# Patient Record
Sex: Female | Born: 1955 | Race: White | Hispanic: No | Marital: Married | Smoking: Former smoker
Health system: Southern US, Community
[De-identification: ages and names within clinical notes are randomized; demographics above are authoritative.]

## PROBLEM LIST (undated history)

## (undated) HISTORY — PX: ELBOW SURGERY: SHX618

## (undated) HISTORY — PX: TONSILLECTOMY: SHX5217

## (undated) HISTORY — PX: VAGINAL HYSTERECTOMY: SHX2639

## (undated) HISTORY — PX: OTHER SURGICAL HISTORY: SHX169

---

## 2007-10-27 ENCOUNTER — Ambulatory Visit: Payer: Self-pay | Admitting: Family Medicine

## 2008-03-06 ENCOUNTER — Ambulatory Visit: Payer: Self-pay | Admitting: Family Medicine

## 2008-12-08 ENCOUNTER — Ambulatory Visit: Payer: Self-pay | Admitting: Family Medicine

## 2009-06-12 IMAGING — CR DG FOREARM 2V*L*
1 series · 2 of 2 positions shown · non-contrast
Comparison: None

REASON FOR EXAM: FALL - PAINFUL
COMMENTS:

PROCEDURE:     MDR - MDR FOREARM LEFT  - March 06, 2008  [DATE]
RESULT:     History: Fall

[Series 1: view not recorded · 0.17mm/px · 2 of 2 slices shown]
[im 1/2]
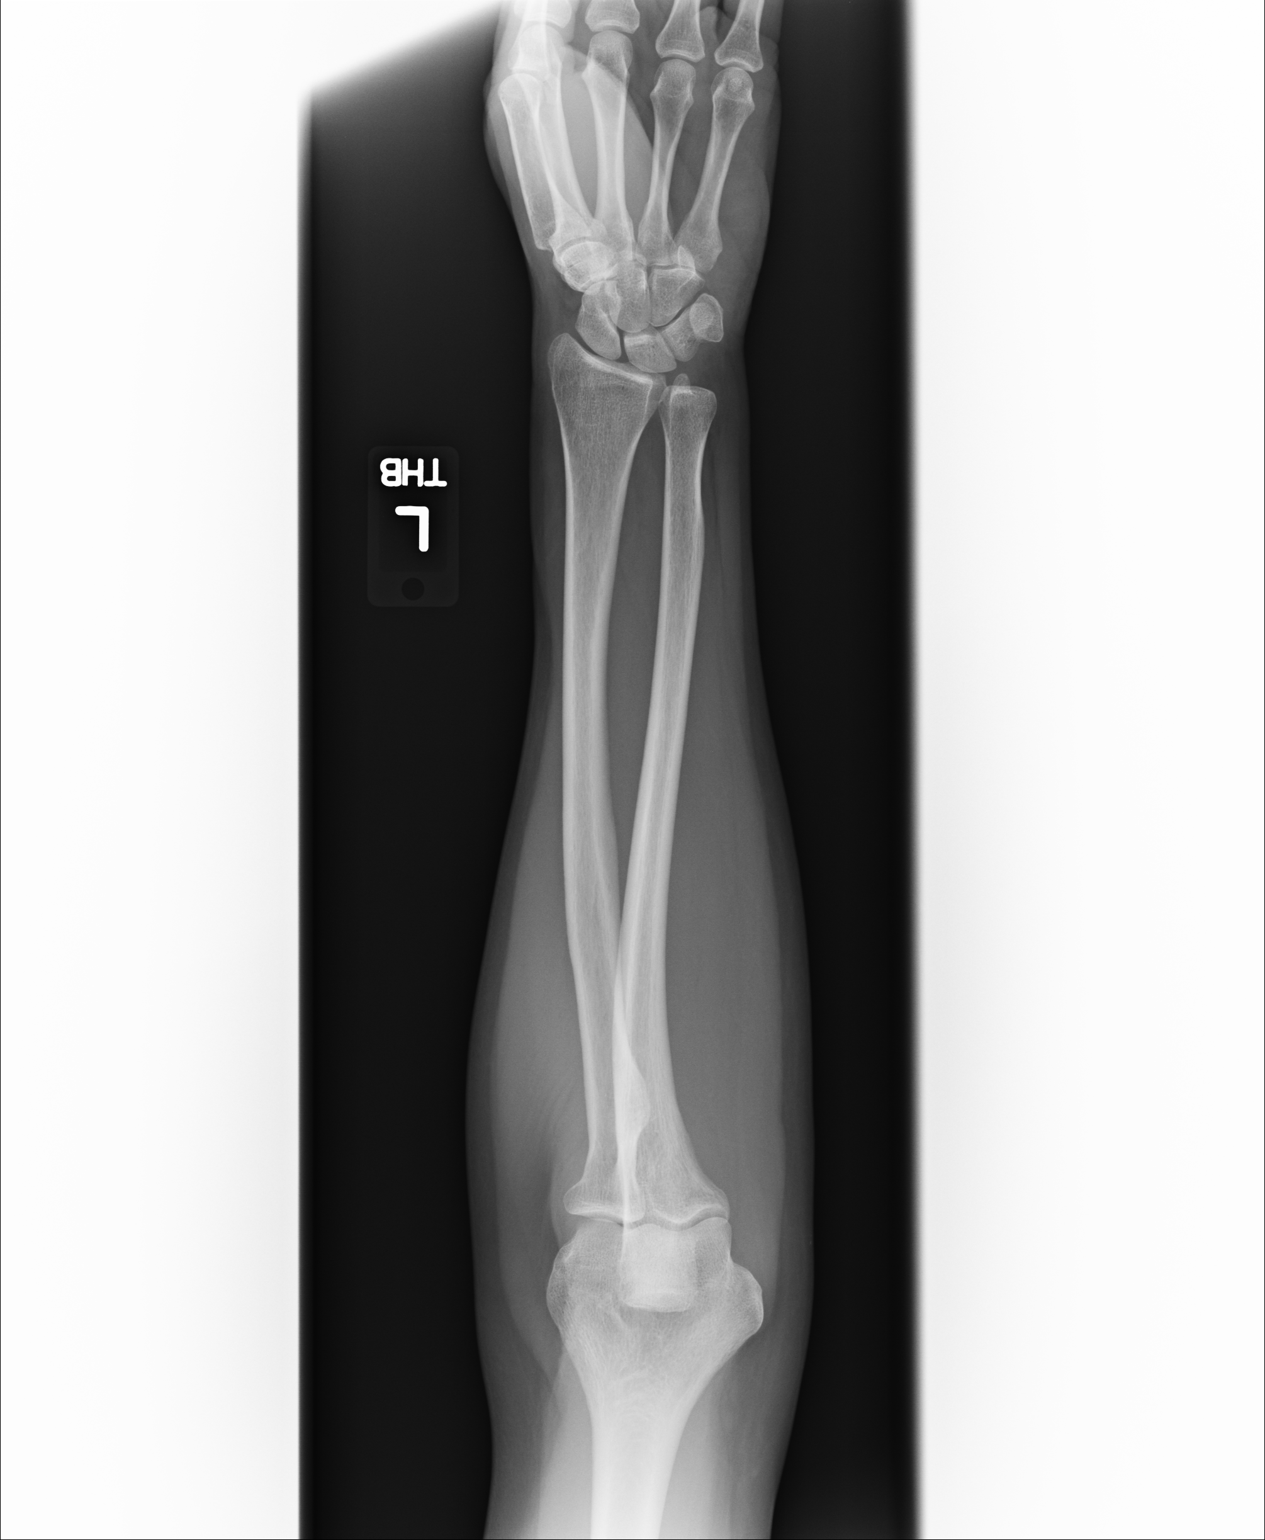
[im 2/2]
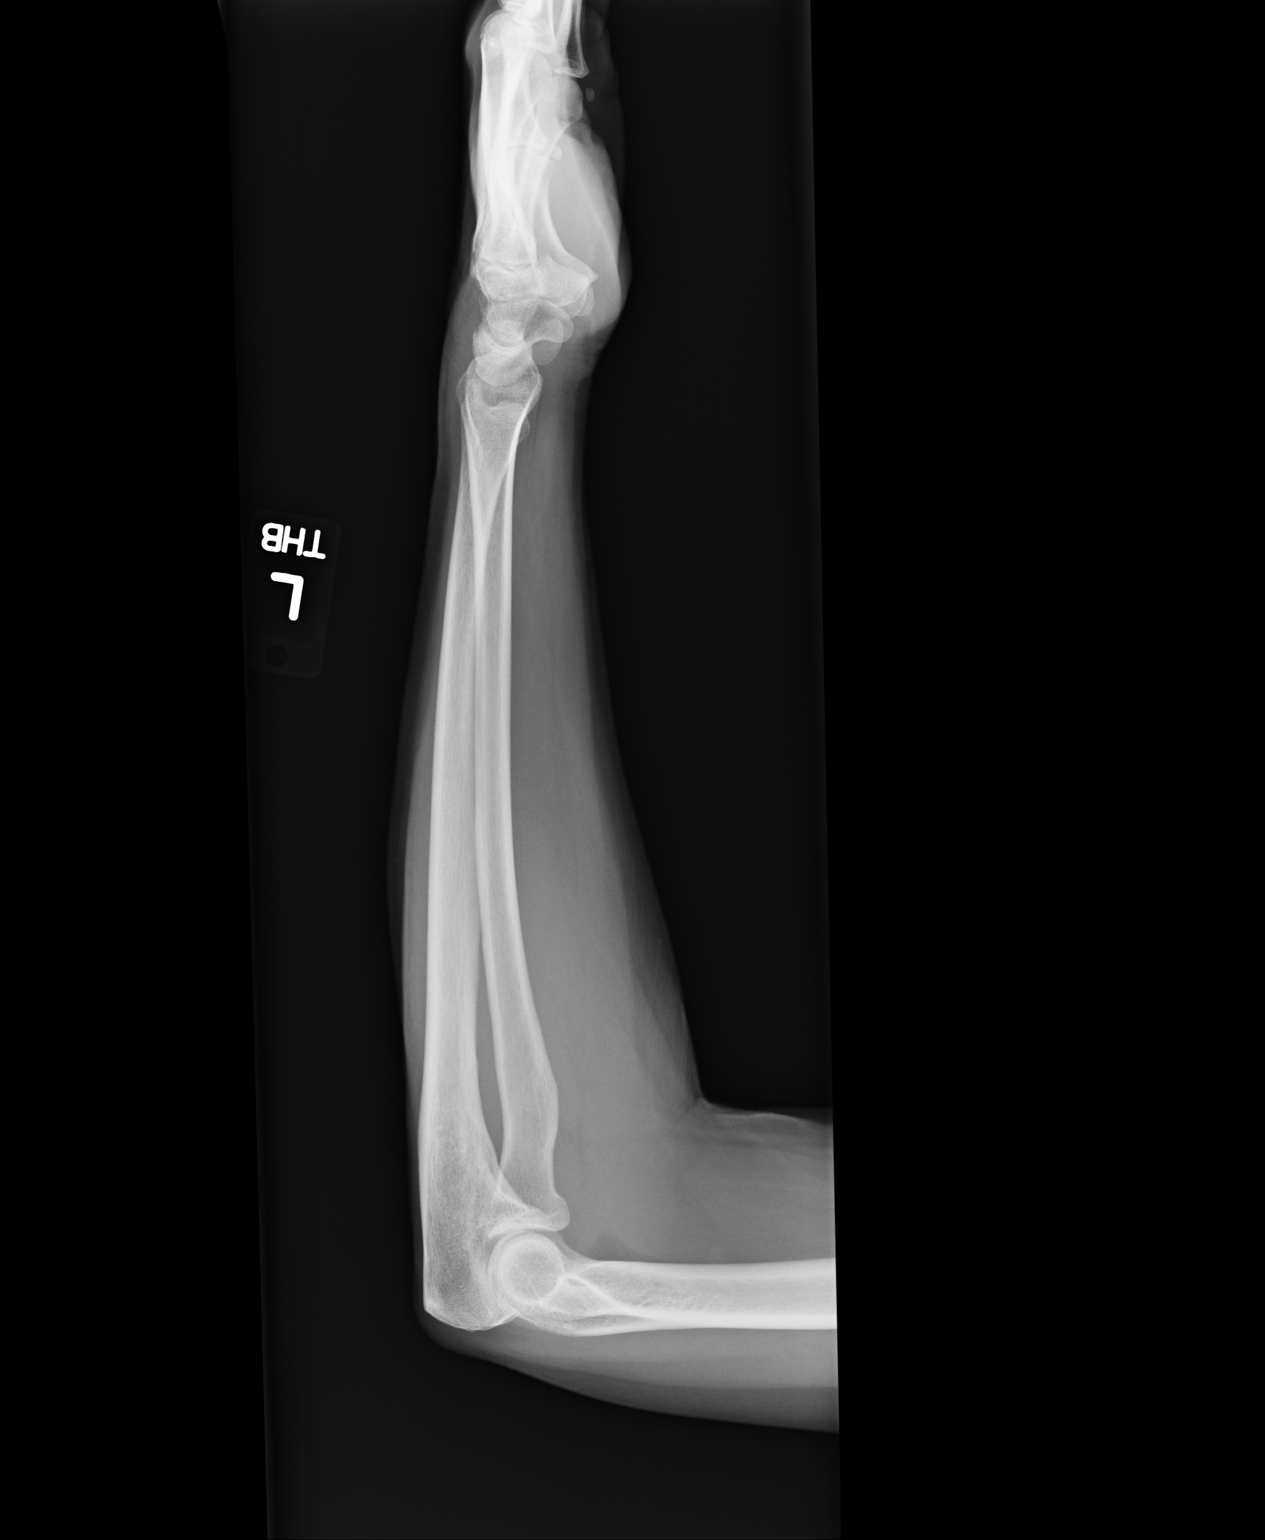

[2 of 2 positions shown; findings below may reference images not displayed]

FINDINGS: Two views of the left radius and ulna demonstrates no fracture or
dislocation. The soft tissues are normal.
IMPRESSION: No acute osseous injury of the left radius and ulna.

## 2009-08-13 ENCOUNTER — Ambulatory Visit: Payer: Self-pay | Admitting: Unknown Physician Specialty

## 2009-11-10 ENCOUNTER — Ambulatory Visit: Payer: Self-pay | Admitting: Rheumatology

## 2011-02-16 IMAGING — RF DG BARIUM SWALLOW
1 series · 15 of 24 positions shown · non-contrast
Comparison: none

REASON FOR EXAM: Diff Swallowing   eval stricture
COMMENTS:

[Series 1: run · 14 acquisitions, 15 frames shown]
[im 1/14]
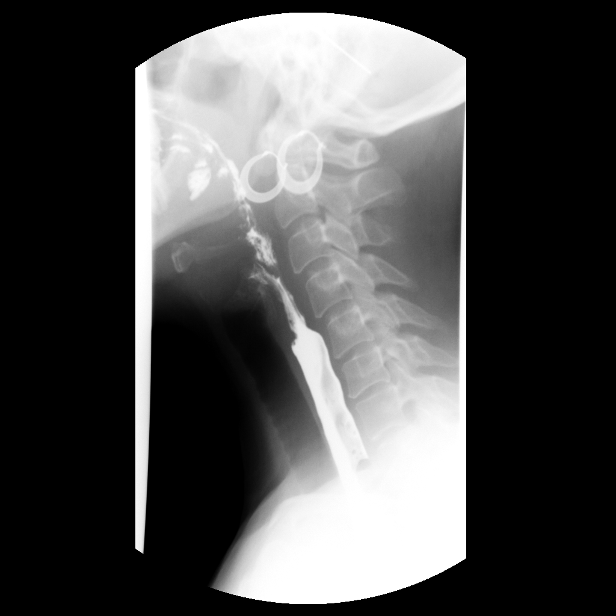
[im 1/14]
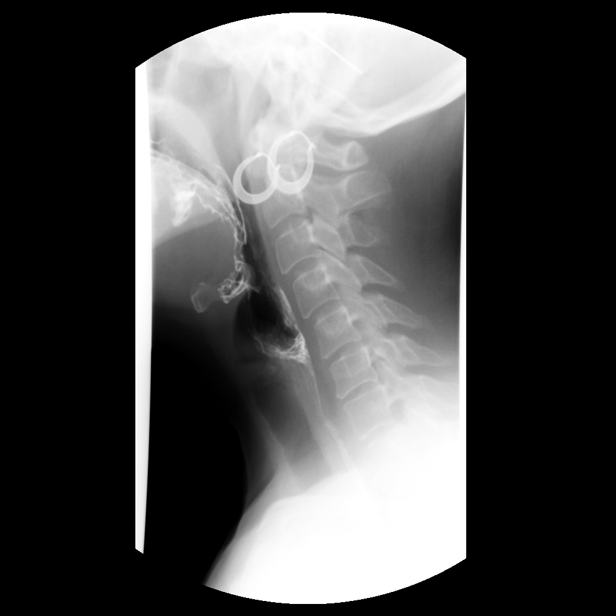
[im 2/14]
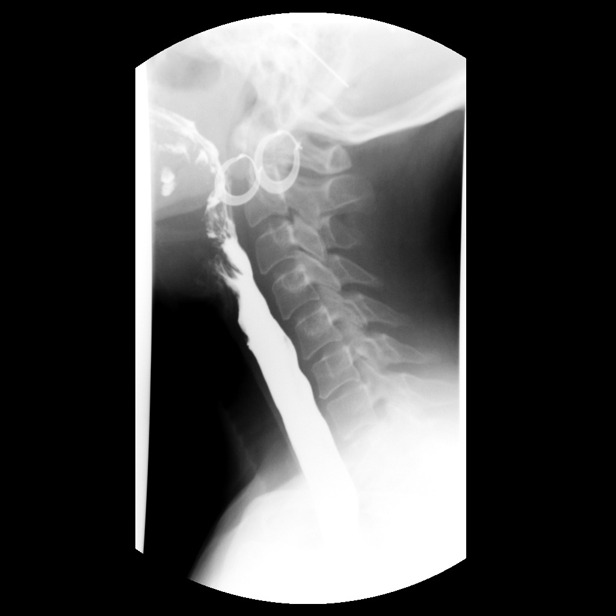
[im 2/14]
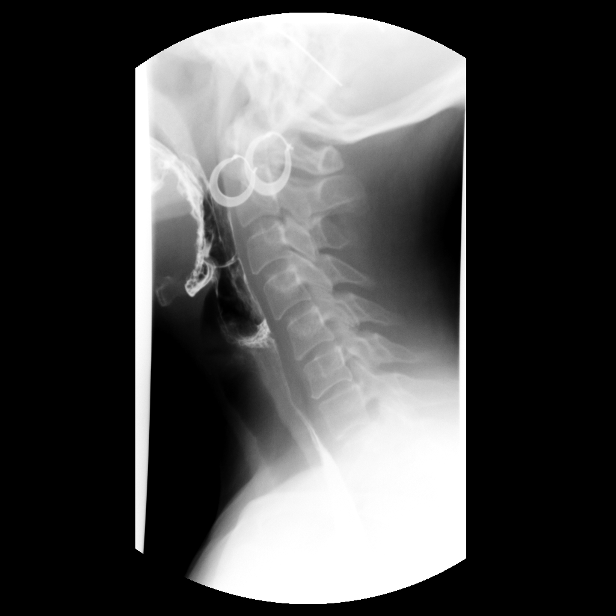
[im 3/14]
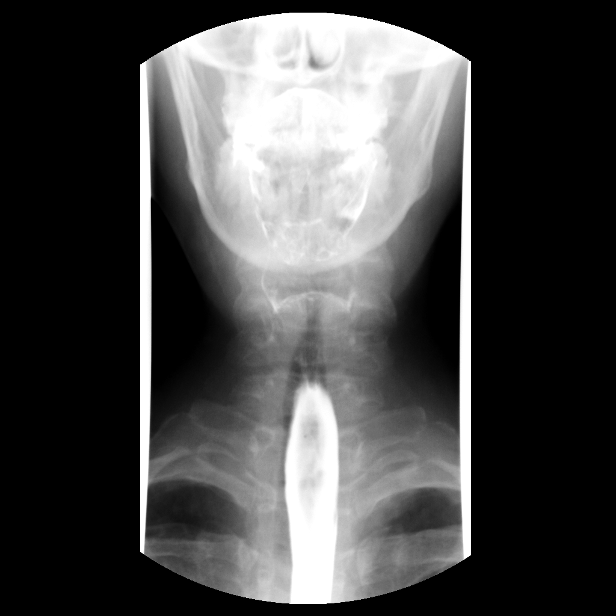
[im 3/14]
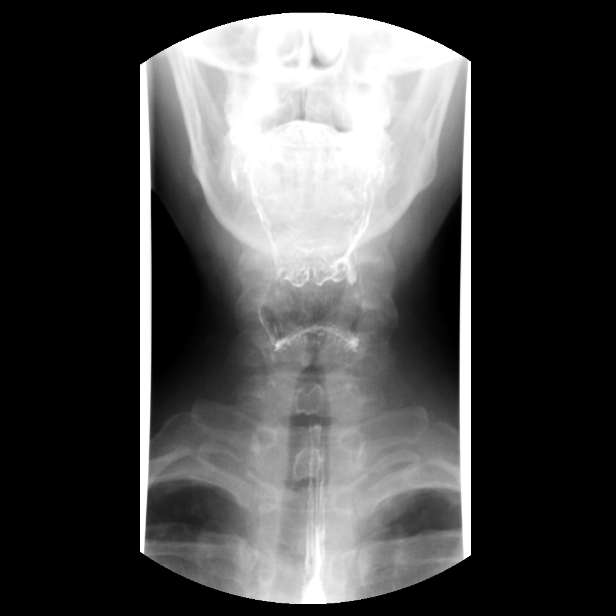
[im 4/14]
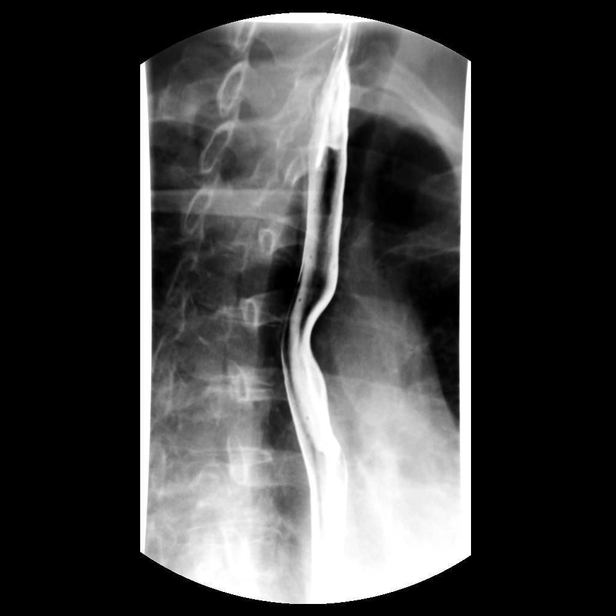
[im 5/14]
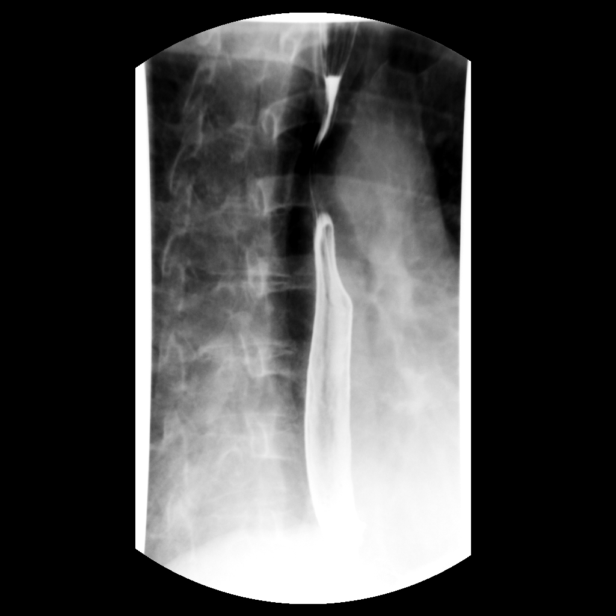
[im 6/14]
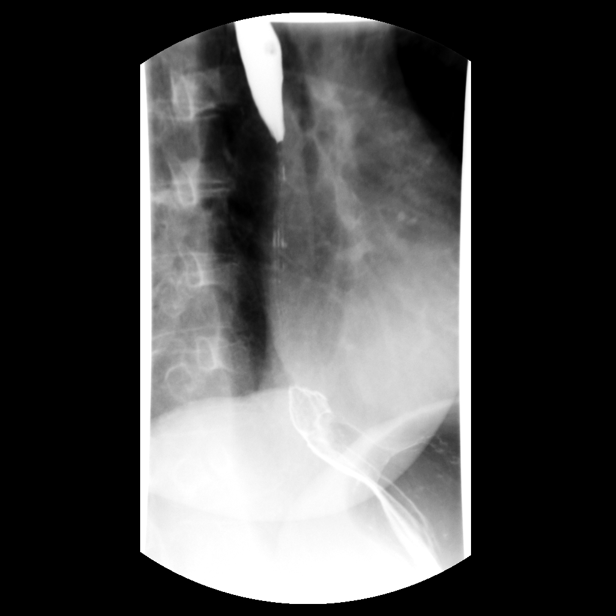
[im 7/14]
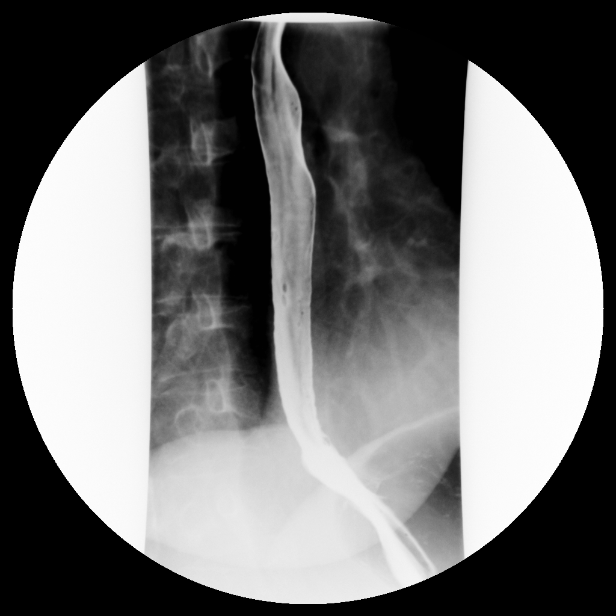
[im 7/14]
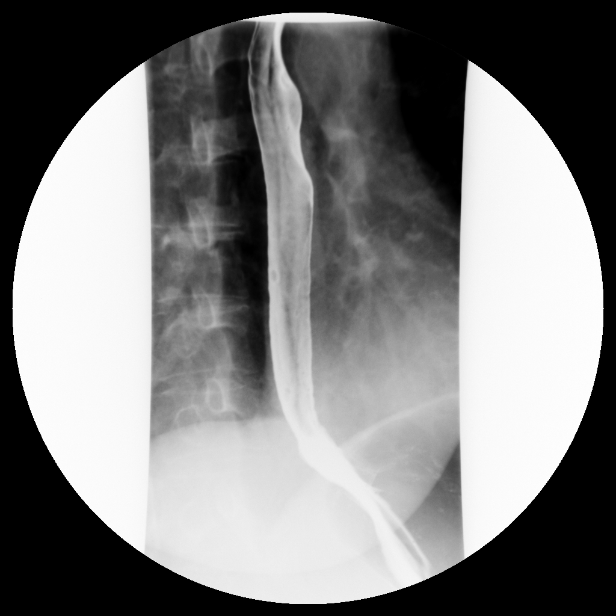
[im 8/14]
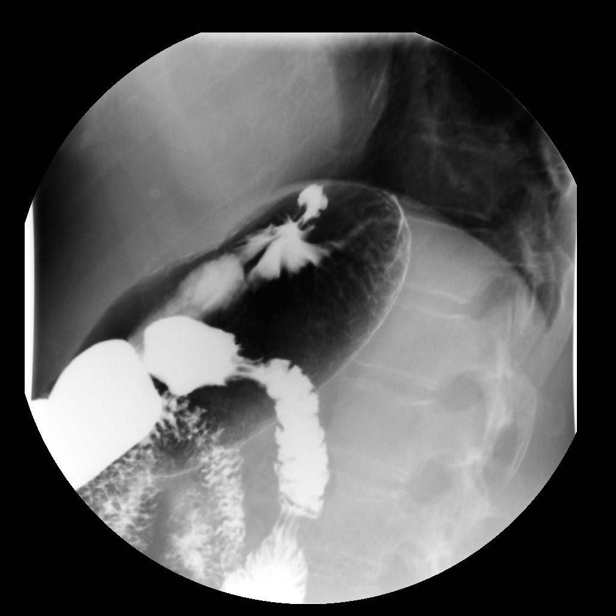
[im 11/14]
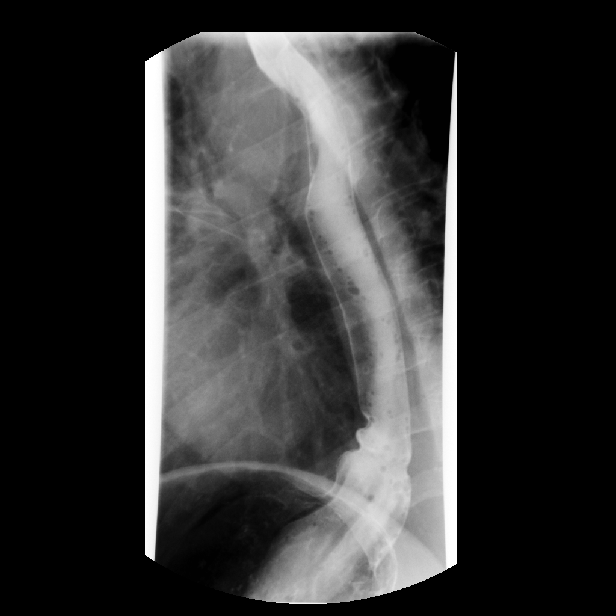
[im 12/14]
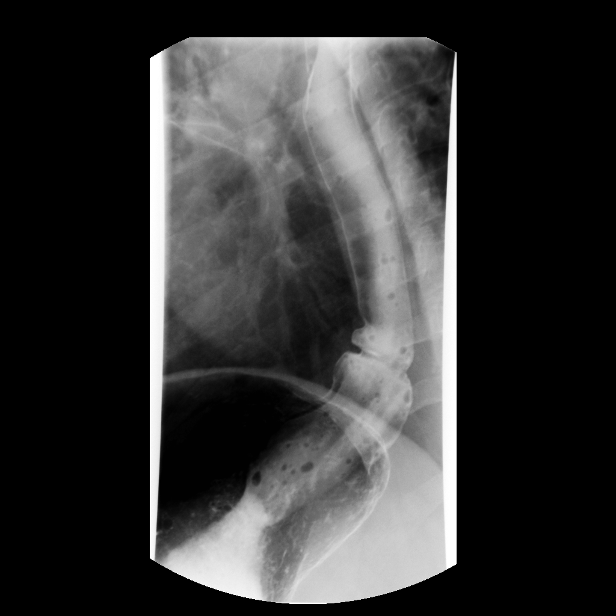
[im 14/14]
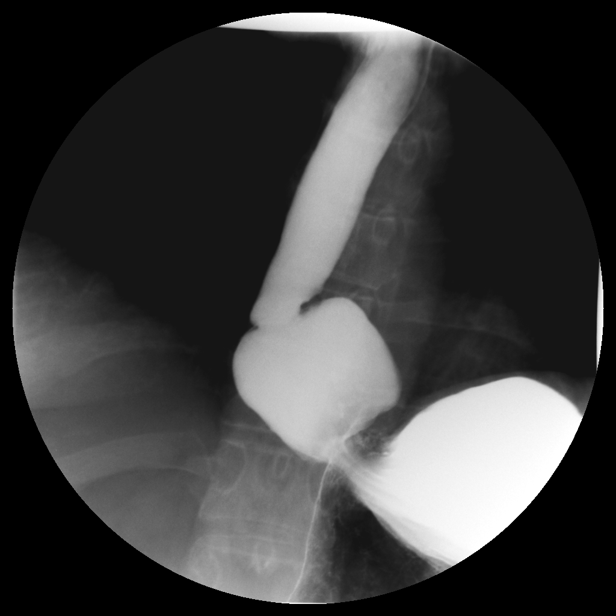

[15 of 24 positions shown; findings below may reference images not displayed]

PROCEDURE:     FL  - FL BARIUM SWALLOW  - November 10, 2009  [DATE]

RESULT:     Comparison: None.

Technique and Findings:
The patient ingested varying consistencies of barium, with monitoring by
intermittent fluoroscopy. There was normal swallowing function in the
hypopharynx. No evidence of aspiration. Barium passed easily into the
thoracic esophagus. The esophagus was normal in caliber. No evidence of the
mucosal ulceration or stricture. There is normal esophageal peristalsis.
There was a moderate size hiatal hernia. This slightly increased in size to
an examination. Barium remained within the hiatal hernia and was
demonstrated to reflux to the midesophagus. The patient was given a 13 mm
tablet which passed easily into the stomach.
IMPRESSION: Moderate size hiatal hernia, with esophageal reflux superiorly from the
hernia.

## 2011-08-09 ENCOUNTER — Ambulatory Visit: Payer: Self-pay | Admitting: Internal Medicine

## 2012-11-14 IMAGING — CR RIGHT ELBOW - COMPLETE 3+ VIEW
1 series · 4 of 4 positions shown · non-contrast
Comparison: none

REASON FOR EXAM: psinful. swollen
COMMENTS:

PROCEDURE:     MDR - MDR ELBOW RT COMP W/ OBLIQUES  - August 09, 2011  [DATE]
RESULT:     Images of the right elbow demonstrated fracture in the radial
head with minimal distraction. The distal humerus appears intact. The
proximal ulna appears unremarkable.

[Series 1: lat · 0.17mm/px · 4 of 4 slices shown]
[im 1/4]
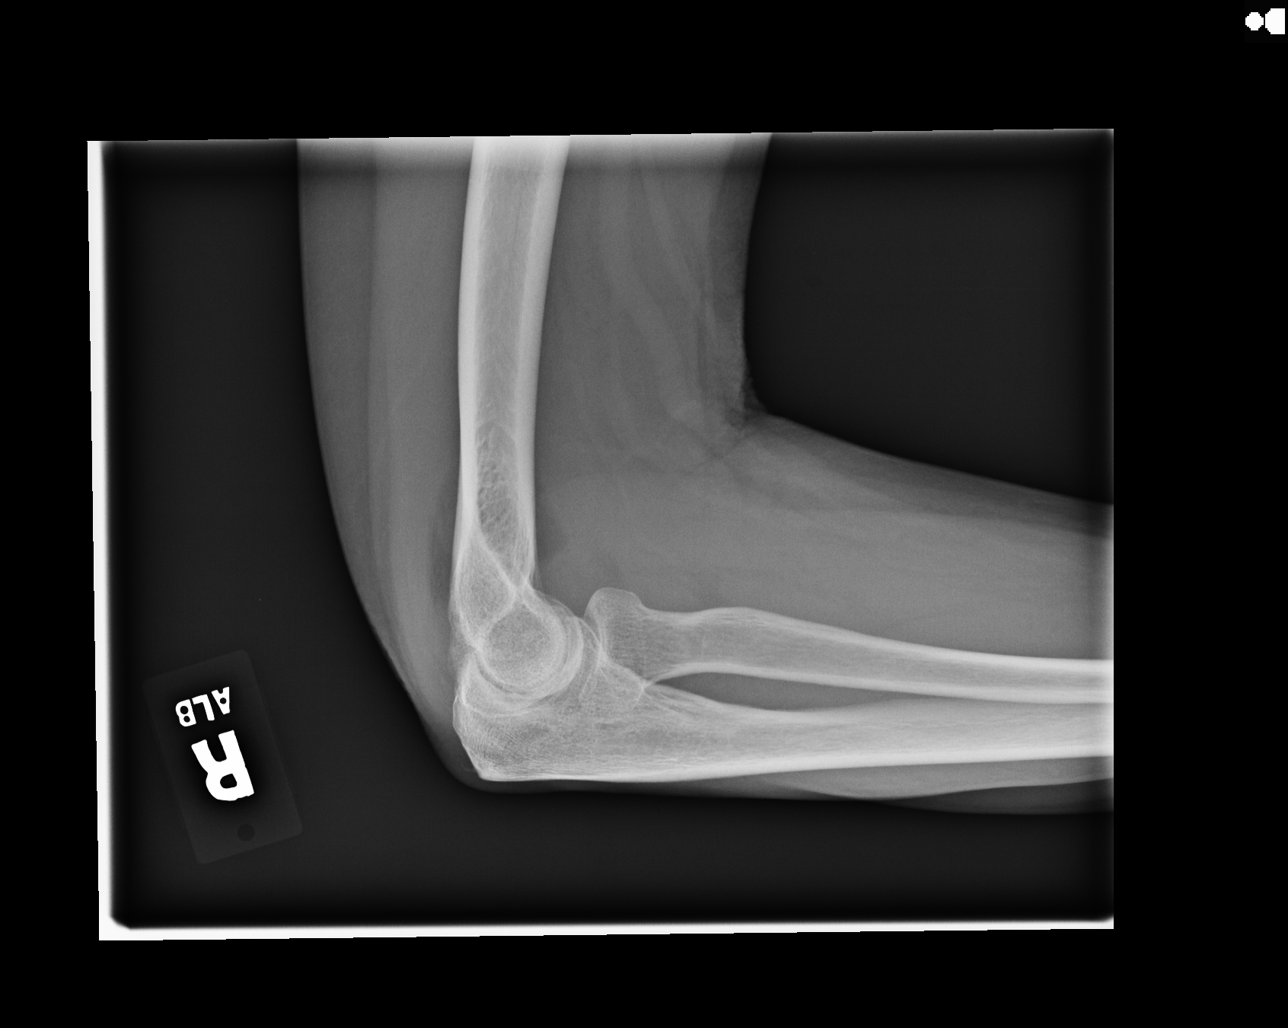
[im 2/4]
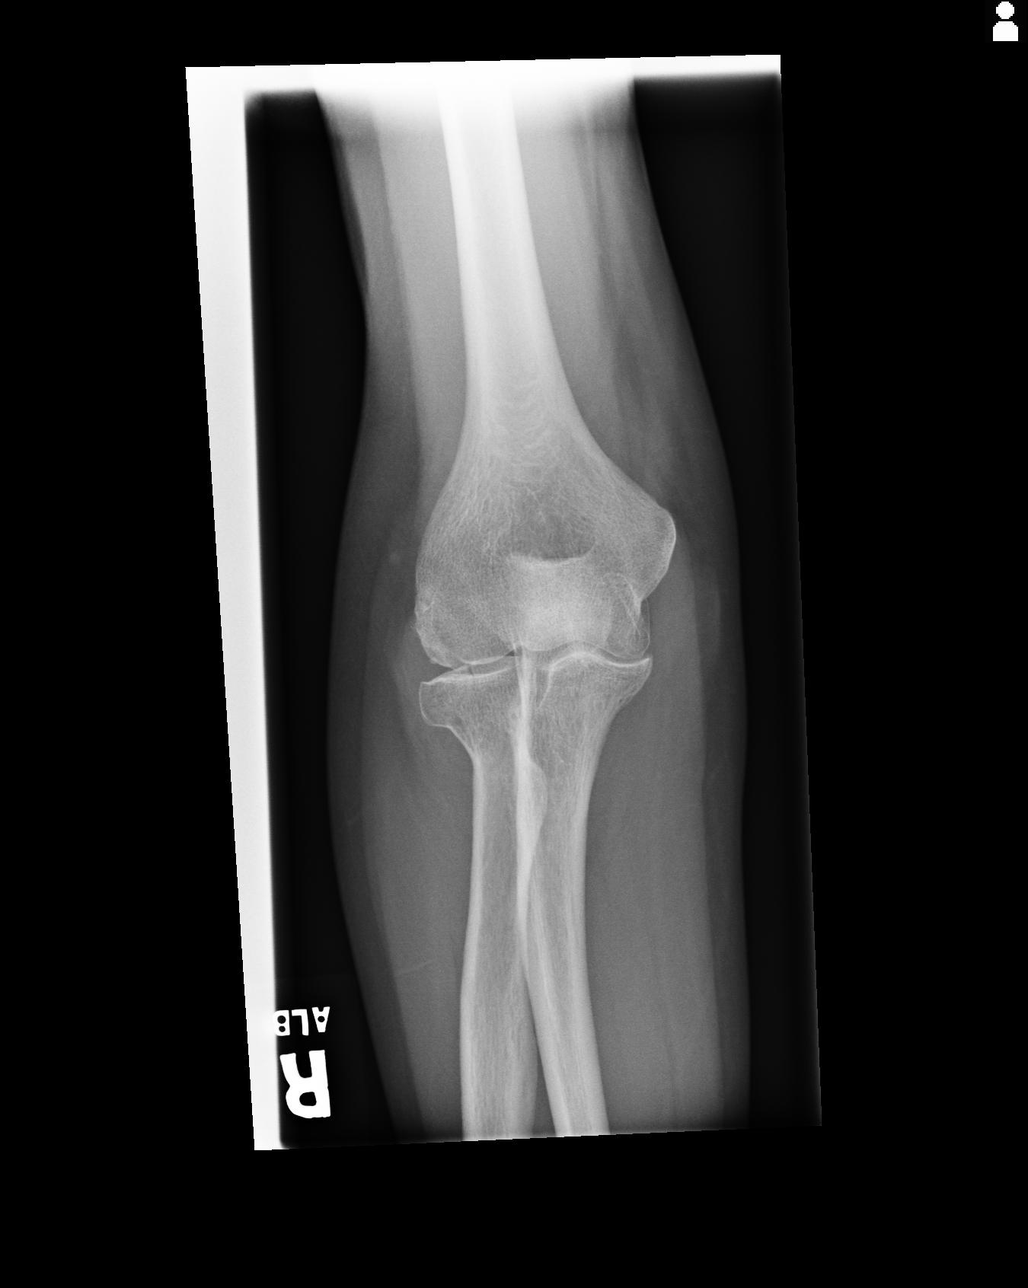
[im 3/4]
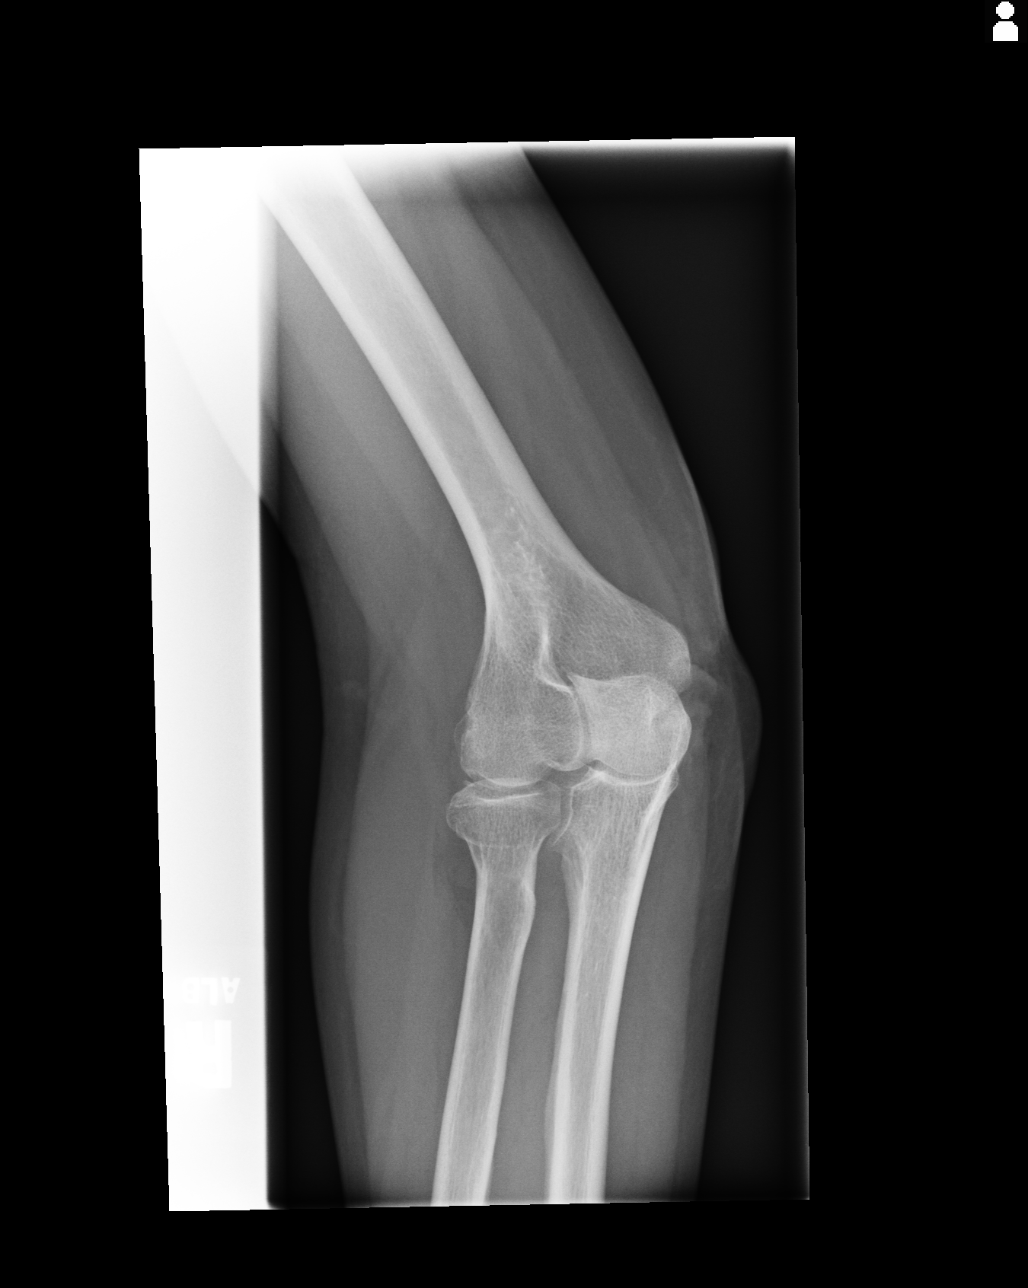
[im 4/4]
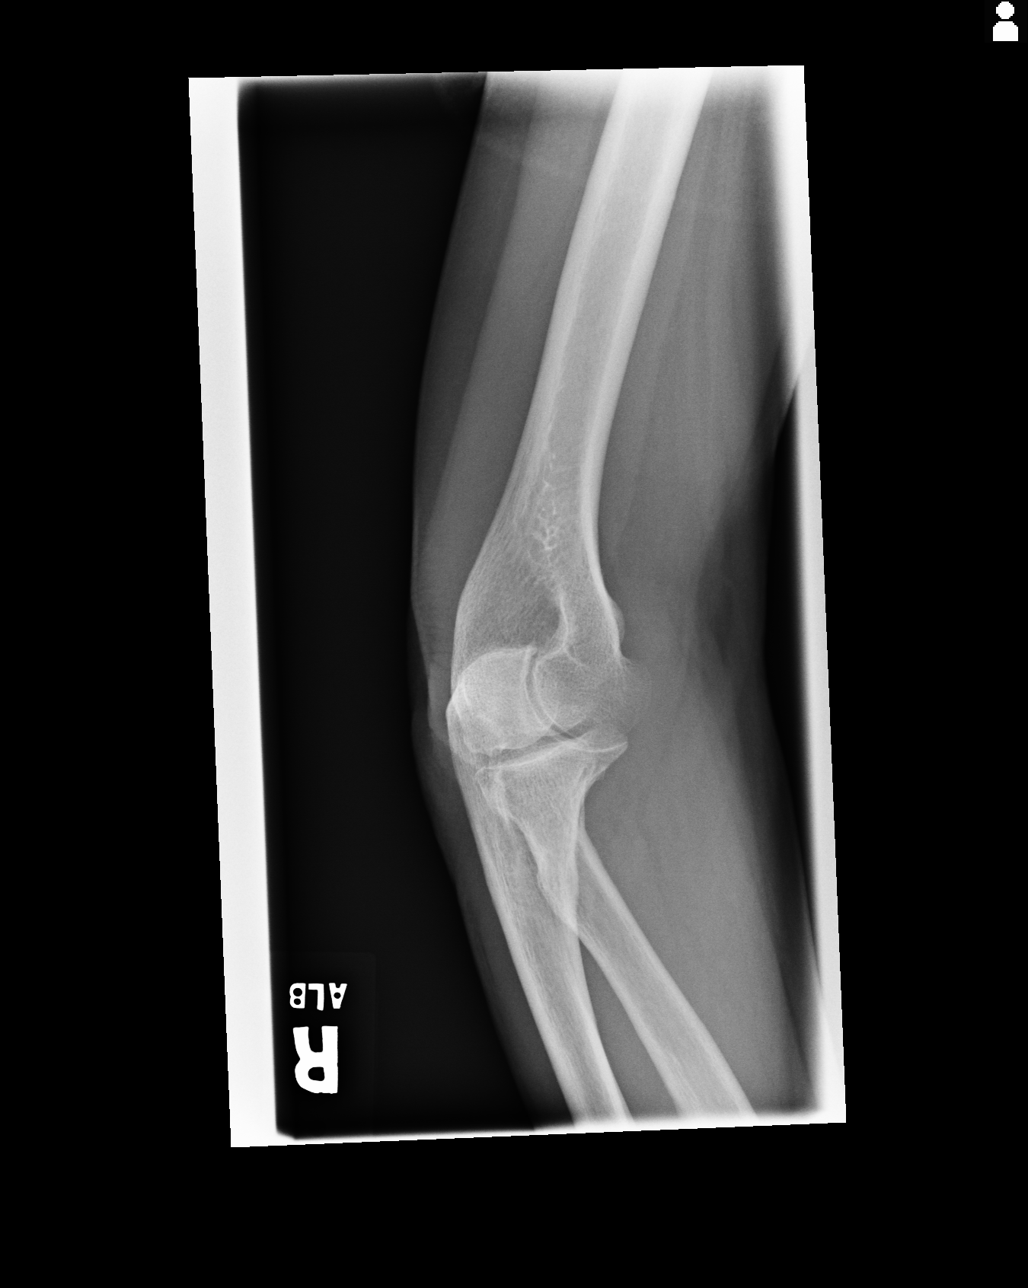

[4 of 4 positions shown; findings below may reference images not displayed]

IMPRESSION: 1. Nondisplaced radial head fracture. Orthopedic followup is recommended.

[REDACTED]

## 2015-04-30 ENCOUNTER — Encounter: Payer: Self-pay | Admitting: Family Medicine

## 2015-04-30 ENCOUNTER — Ambulatory Visit (INDEPENDENT_AMBULATORY_CARE_PROVIDER_SITE_OTHER): Payer: BLUE CROSS/BLUE SHIELD | Admitting: Family Medicine

## 2015-04-30 VITALS — BP 130/100 | HR 62 | Temp 98.8°F | Ht 66.0 in | Wt 156.0 lb

## 2015-04-30 DIAGNOSIS — J0100 Acute maxillary sinusitis, unspecified: Secondary | ICD-10-CM

## 2015-04-30 DIAGNOSIS — H66001 Acute suppurative otitis media without spontaneous rupture of ear drum, right ear: Secondary | ICD-10-CM | POA: Diagnosis not present

## 2015-04-30 DIAGNOSIS — J40 Bronchitis, not specified as acute or chronic: Secondary | ICD-10-CM | POA: Diagnosis not present

## 2015-04-30 MED ORDER — AMOXICILLIN-POT CLAVULANATE 875-125 MG PO TABS: 1.0000 | ORAL_TABLET | Freq: Two times a day (BID) | ORAL | Status: DC

## 2015-04-30 MED ORDER — GUAIFENESIN-CODEINE 100-10 MG/5ML PO SOLN: 5.0000 mL | Freq: Three times a day (TID) | ORAL | Status: DC | PRN

## 2015-04-30 MED ORDER — ANTIPYRINE-BENZOCAINE 5.4-1.4 % OT SOLN: 3.0000 [drp] | OTIC | Status: DC | PRN

## 2015-04-30 NOTE — Progress Notes (Signed)
Name: Wanda House   MRN: [id number]    DOB: [birth year]   Date:04/30/2015       Progress Note  Subjective  Chief Complaint  Chief Complaint  Patient presents with  . Sinusitis    R) ear pain, cong, cough- yellow prod    Sinusitis This is a new problem. The current episode started in the past 7 days. The problem has been waxing and waning since onset. The maximum temperature recorded prior to her arrival was 100.4 - 100.9 F. Associated symptoms include chills, congestion, coughing, diaphoresis, ear pain, headaches, sinus pressure, a sore throat and swollen glands. Pertinent negatives include no neck pain, shortness of breath or sneezing. Past treatments include acetaminophen and oral decongestants. The treatment provided mild relief.  Otalgia  There is pain in the right ear. This is a new problem. The current episode started today. The problem has been gradually worsening. The maximum temperature recorded prior to her arrival was 100.4 - 100.9 F. The pain is moderate. Associated symptoms include coughing, headaches, hearing loss, rhinorrhea and a sore throat. Pertinent negatives include no abdominal pain, diarrhea, ear discharge, neck pain or rash. She has tried acetaminophen for the symptoms. There is no history of a chronic ear infection or hearing loss.    No problem-specific assessment & plan notes found for this encounter.   No past medical history on file.  Past Surgical History  Procedure Laterality Date  . Vaginal hysterectomy    . Elbow surgery Right   . Miscarriage    . Tonsillectomy      No family history on file.  Social History   Social History  . Marital Status: Married    Spouse Name: N/A  . Number of Children: N/A  . Years of Education: N/A   Occupational History  . Not on file.   Social History Main Topics  . Smoking status: Former Games developermoker  . Smokeless tobacco: Not on file  . Alcohol Use: 0.0 oz/week    0 Standard drinks or equivalent per week  . Drug  Use: No  . Sexual Activity: Not on file   Other Topics Concern  . Not on file   Social History Narrative  . No narrative on file    No Known Allergies   Review of Systems  Constitutional: Positive for chills and diaphoresis. Negative for fever, weight loss and malaise/fatigue.  HENT: Positive for congestion, ear pain, hearing loss, rhinorrhea, sinus pressure and sore throat. Negative for ear discharge and sneezing.   Eyes: Negative for blurred vision.  Respiratory: Positive for cough. Negative for sputum production, shortness of breath and wheezing.   Cardiovascular: Negative for chest pain, palpitations and leg swelling.  Gastrointestinal: Negative for heartburn, nausea, abdominal pain, diarrhea, constipation, blood in stool and melena.  Genitourinary: Negative for dysuria, urgency, frequency and hematuria.  Musculoskeletal: Negative for myalgias, back pain, joint pain and neck pain.  Skin: Negative for rash.  Neurological: Positive for headaches. Negative for dizziness, tingling, sensory change and focal weakness.  Endo/Heme/Allergies: Negative for environmental allergies and polydipsia. Does not bruise/bleed easily.  Psychiatric/Behavioral: Negative for depression and suicidal ideas. The patient is not nervous/anxious and does not have insomnia.      Objective  Filed Vitals:   04/30/15 1123  BP: 130/100  Pulse: 62  Temp: 98.8 F (37.1 C)  TempSrc: Oral  Height: 5\' 6"  (1.676 m)  Weight: 156 lb (70.761 kg)    Physical Exam  Constitutional: She is well-developed, well-nourished, and in  no distress. No distress.  HENT:  Head: Normocephalic and atraumatic.  Right Ear: External ear normal.  Left Ear: External ear normal.  Nose: Nose normal.  Mouth/Throat: Oropharynx is clear and moist.  Eyes: Conjunctivae and EOM are normal. Pupils are equal, round, and reactive to light. Right eye exhibits no discharge. Left eye exhibits no discharge.  Neck: Normal range of motion.  Neck supple. No JVD present. No thyromegaly present.  Cardiovascular: Normal rate, regular rhythm, normal heart sounds and intact distal pulses.  Exam reveals no gallop and no friction rub.   No murmur heard. Pulmonary/Chest: Effort normal and breath sounds normal. No respiratory distress. She has no wheezes. She has no rales.  Abdominal: Soft. Bowel sounds are normal. She exhibits no mass. There is no tenderness. There is no guarding.  Musculoskeletal: Normal range of motion. She exhibits no edema.  Lymphadenopathy:    She has no cervical adenopathy.  Neurological: She is alert. She has normal reflexes.  Skin: Skin is warm and dry. She is not diaphoretic.  Psychiatric: Mood and affect normal.  Nursing note and vitals reviewed.     Assessment & Plan  Problem List Items Addressed This Visit    None    Visit Diagnoses    Acute suppurative otitis media of right ear without spontaneous rupture of tympanic membrane, recurrence not specified    -  Primary    Relevant Medications    amoxicillin-clavulanate (AUGMENTIN) 875-125 MG tablet    Acute maxillary sinusitis, recurrence not specified        Relevant Medications    amoxicillin-clavulanate (AUGMENTIN) 875-125 MG tablet    guaiFENesin-codeine 100-10 MG/5ML syrup    Bronchitis             Dr. Hayden [facility]  04/30/2015

## 2015-05-05 ENCOUNTER — Ambulatory Visit
Admission: EM | Admit: 2015-05-05 | Discharge: 2015-05-05 | Disposition: A | Discharge status: Home / Self Care | Payer: BLUE CROSS/BLUE SHIELD | Attending: Family Medicine | Admitting: Family Medicine

## 2015-05-05 ENCOUNTER — Encounter: Payer: Self-pay | Admitting: Emergency Medicine

## 2015-05-05 DIAGNOSIS — H6501 Acute serous otitis media, right ear: Secondary | ICD-10-CM | POA: Diagnosis not present

## 2015-05-05 DIAGNOSIS — H6981 Other specified disorders of Eustachian tube, right ear: Secondary | ICD-10-CM

## 2015-05-05 MED ORDER — HYDROCODONE-ACETAMINOPHEN 5-325 MG PO TABS: 1.0000 | ORAL_TABLET | Freq: Four times a day (QID) | ORAL | Status: DC | PRN

## 2015-05-05 NOTE — ED Provider Notes (Signed)
CSN: [id number]     Arrival date & time 05/05/15  1759 History   First MD Initiated Contact with Patient 05/05/15 1927     Chief Complaint  Patient presents with  . Otalgia   (Consider location/radiation/quality/duration/timing/severity/associated sxs/prior Treatment) HPI Comments: 60 yo female with a 1 week h/o right ear pain and infection. States was seen by PCP last week and started on Augmentin for middle ear infection. (still taking). States started draining and felt better temporarily but now worsening again. Denies fevers, chills.   The history is provided by the patient.    History reviewed. No pertinent past medical history. Past Surgical History  Procedure Laterality Date  . Vaginal hysterectomy    . Elbow surgery Right   . Miscarriage    . Tonsillectomy     History reviewed. No pertinent family history. Social History  Substance Use Topics  . Smoking status: Former Games developer  . Smokeless tobacco: None  . Alcohol Use: 0.0 oz/week    0 Standard drinks or equivalent per week   OB History    No data available     Review of Systems  Allergies  Review of patient's allergies indicates no known allergies.  Home Medications   Prior to Admission medications   Medication Sig Start Date End Date Taking? Authorizing Provider  amoxicillin-clavulanate (AUGMENTIN) 875-125 MG tablet Take 1 tablet by mouth 2 (two) times daily. 04/30/15   Duanne Limerick, MD  antipyrine-benzocaine Lyla Son) otic solution Place 3-4 drops into the right ear every 2 (two) hours as needed for ear pain. 04/30/15   Duanne Limerick, MD  guaiFENesin-codeine 100-10 MG/5ML syrup Take 5 mLs by mouth 3 (three) times daily as needed. 04/30/15   Duanne Limerick, MD  HYDROcodone-acetaminophen (NORCO/VICODIN) 5-325 MG tablet Take 1-2 tablets by mouth every 6 (six) hours as needed. 05/05/15   Payton Mccallum, MD   Meds Ordered and Administered this Visit  Medications - No data to display  BP 138/84 mmHg  Pulse 77   Temp(Src) 97.1 F (36.2 C) (Tympanic)  Resp 18  Ht  (1.676 m)  Wt 150 lb (68.04 kg)  BMI 24.22 kg/m2  SpO2 100% No data found.   Physical Exam  Constitutional: She appears well-developed and well-nourished. No distress.  HENT:  Head: Normocephalic and atraumatic.  Right Ear: External ear and ear canal normal. Tympanic membrane is erythematous and bulging. A middle ear effusion is present.  Left Ear: Tympanic membrane, external ear and ear canal normal.  Ears:  Nose: No mucosal edema, rhinorrhea, nose lacerations, sinus tenderness, nasal deformity, septal deviation or nasal septal hematoma. No epistaxis.  No foreign bodies.  Mouth/Throat: Uvula is midline, oropharynx is clear and moist and mucous membranes are normal. No oropharyngeal exudate.  Small amount of dried blood on right TM  Eyes: Conjunctivae and EOM are normal. Right eye exhibits no discharge. Left eye exhibits no discharge. No scleral icterus.  Neck: Normal range of motion. Neck supple. No JVD present. No tracheal deviation present. No thyromegaly present.  Pulmonary/Chest: Effort normal. No stridor. No respiratory distress.  Lymphadenopathy:    She has cervical adenopathy.  Skin: She is not diaphoretic.  Nursing note and vitals reviewed.   ED Course  Procedures (including critical care time)  Labs Review Labs Reviewed - No data to display  Imaging Review No results found.   Visual Acuity Review  Right Eye Distance:   Left Eye Distance:   Bilateral Distance:    Right Eye Near:  Left Eye Near:    Bilateral Near:         MDM   1. Right acute serous otitis media, recurrence not specified   2. Eustachian tube dysfunction, right    Discharge Medication List as of 05/05/2015  7:54 PM    START taking these medications   Details  HYDROcodone-acetaminophen (NORCO/VICODIN) 5-325 MG tablet Take 1-2 tablets by mouth every 6 (six) hours as needed., Starting 05/05/2015, Until Discontinued, Print         1. diagnosis reviewed with patient 2. rx as per orders above; reviewed possible side effects, interactions, risks and benefits; recommend continue current antibiotic, Augmentin 3. Recommend follow up with ENT this week for further evaluation and management   Payton Mccallum, MD 05/05/15 2025

## 2015-05-05 NOTE — ED Notes (Signed)
Patient states she has been on antibiotics for sinus and ear infection since Wednesday of last week. Is having pain and swelling in ear and throat

## 2015-07-06 ENCOUNTER — Encounter: Payer: Self-pay | Admitting: *Deleted

## 2015-07-06 ENCOUNTER — Ambulatory Visit
Admission: EM | Admit: 2015-07-06 | Discharge: 2015-07-06 | Disposition: A | Discharge status: Home / Self Care | Payer: BLUE CROSS/BLUE SHIELD | Attending: Emergency Medicine | Admitting: Emergency Medicine

## 2015-07-06 DIAGNOSIS — R1031 Right lower quadrant pain: Secondary | ICD-10-CM

## 2015-07-06 DIAGNOSIS — R000 Tachycardia, unspecified: Secondary | ICD-10-CM

## 2015-07-06 DIAGNOSIS — R509 Fever, unspecified: Secondary | ICD-10-CM

## 2015-07-06 MED ORDER — ONDANSETRON 8 MG PO TBDP
8.0000 mg | ORAL_TABLET | Freq: Once | ORAL | Status: AC
  Administered 2015-07-06: 8 mg via ORAL

## 2015-07-06 MED ORDER — KETOROLAC TROMETHAMINE 60 MG/2ML IM SOLN
60.0000 mg | Freq: Once | INTRAMUSCULAR | Status: AC
  Administered 2015-07-06: 60 mg via INTRAMUSCULAR

## 2015-07-06 NOTE — ED Notes (Signed)
Patient started having right upper quadrant abdominal pain 2 days ago. Additional symptoms of nausea and fever are present. Patient still has gallbladder and appendix.

## 2015-07-06 NOTE — ED Provider Notes (Signed)
HPI  SUBJECTIVE:  Wanda House is a 60 y.o. female who presents with achy, intermittent, nonradiating, nonmigratory right flank pain for the past 2-3 days. She reports nausea, anorexia. States the car right over here was painful. Symptoms worse with movement, walking, better with lying still. She tried Tylenol and ibuprofen with minimal improvement in symptoms. She has taken antipyretic in the past 4-6 hours. She denies vomiting, fevers at home. No urinary urgency, frequency, cloudy or odorous urine, hematuria. No vaginal bleeding, discharge, genital rash. No coughing, wheezing, chest pain, shortness breath, syncope. The abdominal pain is not associated with eating, fasting, urination, defecation. No sick contacts, raw or undercooked foods, questionable leftovers. She does not take any medications on a regular basis. Past medical history patient is status post hysterectomy and oophorectomy. No history of diabetes, hypertension, UTI, pyelonephritis, nephrolithiasis, diverticulitis, diverticulosis.  History reviewed. No pertinent past medical history.  Past Surgical History  Procedure Laterality Date  . Vaginal hysterectomy    . Elbow surgery Right   . Miscarriage    . Tonsillectomy      History reviewed. No pertinent family history.  Social History  Substance Use Topics  . Smoking status: Former Games developermoker  . Smokeless tobacco: None  . Alcohol Use: 0.0 oz/week    0 Standard drinks or equivalent per week    No current facility-administered medications for this encounter. No current outpatient prescriptions on file.  No Known Allergies   ROS  As noted in HPI.   Physical Exam  BP 132/68 mmHg  Pulse 119  Temp(Src) 100.2 F (37.9 C) (Oral)  Resp 18  Ht 5\' 6"  (1.676 m)  Wt 150 lb (68.04 kg)  BMI 24.22 kg/m2  SpO2 98%  Constitutional: Well developed, well nourished, no acute distress Eyes: PERRL, EOMI, conjunctiva normal bilaterally HENT: Normocephalic, atraumatic,mucus  membranes moist Respiratory: Clear to auscultation bilaterally, no rales, no wheezing, no rhonchi Cardiovascular: Normal rate and rhythm, no murmurs, no gallops, no rubs GI: Normal appearance. Exquisite right lower quadrant tenderness, no guarding, rebound. Right flank tenderness to the in the midaxillary line. Negative Murphy. Negative Rovsing. Negative obturator or psoas sign. Active bowel sounds. No distention. Abdomen soft. Back: no CVAT skin: No rash, skin intact Musculoskeletal: No edema, no tenderness, no deformities Neurologic: Alert & oriented x 3, CN II-XII grossly intact, no motor deficits, sensation grossly intact Psychiatric: Speech and behavior appropriate   ED Course   Medications  ondansetron (ZOFRAN-ODT) disintegrating tablet 8 mg (8 mg Oral Given 07/06/15 1139)  ketorolac (TORADOL) injection 60 mg (60 mg Intramuscular Given 07/06/15 1138)    No orders of the defined types were placed in this encounter.   No results found for this or any previous visit (from the past 24 hour(s)). No results found.  ED Clinical Impression  Right lower quadrant abdominal pain  Fever, unspecified fever cause  Tachycardia   ED Assessment/Plan  Patient febrile, tachycardic. She has exquisite right lower quadrant tenderness, also right flank tenderness. Concern for appendicitis versus a colitis or diverticulitis. She was given Toradol, Zofran. [facility]. feel the patient is stable enough to go by private vehicle. Advised patient to let the ED know if her abdominal pain changes, gets worse, if it gets worse and then gets better,. Also advised her to let them know that she received Toradol and Zofran here, but that she has been nothing by mouth. Told patient did not have anything to eat or drink until  she is evaluated by the ED. Patient agrees with plan.  *This clinic note was created using Dragon dictation software. Therefore, there may be occasional  mistakes despite careful proofreading.  ?  Domenick Gong, MD 07/06/15 [phone]

## 2015-07-06 NOTE — Discharge Instructions (Signed)
I'm concerned that you could have an appendicitis, diverticulitis, or colitis, or an obstructing kidney stone. It is less likely that this is your gallbladder. Make sure you do not have anything to eat or drink until you were evaluated by the emergency room. Let the emergency room know that you received a shot of Toradol and Zofran disintegrating tablet here in the urgent care. Let them know if your belly pain changes, gets worse, gets worse, and then suddenly gets better.
